# Patient Record
Sex: Male | Born: 1995 | Race: Black or African American | Hispanic: No | Marital: Single | Smoking: Former smoker
Health system: Southern US, Community
[De-identification: ages and names within clinical notes are randomized; demographics above are authoritative.]

## PROBLEM LIST (undated history)

## (undated) DIAGNOSIS — I10 Essential (primary) hypertension: Secondary | ICD-10-CM

---
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
## 2018-02-05 ENCOUNTER — Encounter (HOSPITAL_COMMUNITY): Payer: Self-pay | Admitting: Physical Therapy

## 2018-12-16 ENCOUNTER — Other Ambulatory Visit: Payer: Self-pay

## 2018-12-16 DIAGNOSIS — Z20822 Contact with and (suspected) exposure to covid-19: Secondary | ICD-10-CM

## 2018-12-17 LAB — NOVEL CORONAVIRUS, NAA: SARS-CoV-2, NAA: DETECTED — AB

## 2018-12-18 ENCOUNTER — Telehealth: Payer: Self-pay

## 2018-12-18 NOTE — Telephone Encounter (Signed)
Rec'd call from Abigail Butts, Therapist, sports at [facility]  Reported she called a patient to advise of his COVID positive test result.  Reported "the pt. was very irate."  Reported he was upset because he tested positive, and his girlfriend tested negative.  Per [facility] Nurse, the pt. Was threatening to sue.  Stated she attempted to give him recommendations for quarantining.  Stated he refused to take any precautions.  Reported she was sending the police to his home with Stay at Home Orders.  Stated she wanted [facility] to made aware of this situation.

## 2019-05-13 ENCOUNTER — Ambulatory Visit (INDEPENDENT_AMBULATORY_CARE_PROVIDER_SITE_OTHER): Payer: Self-pay | Admitting: Family Medicine

## 2019-05-13 ENCOUNTER — Other Ambulatory Visit: Payer: Self-pay

## 2019-05-13 DIAGNOSIS — J039 Acute tonsillitis, unspecified: Secondary | ICD-10-CM

## 2019-05-13 MED ORDER — MAGIC MOUTHWASH: ORAL | 0 refills | Status: DC

## 2019-05-13 MED ORDER — DOXYCYCLINE HYCLATE 100 MG PO TABS: ORAL_TABLET | ORAL | 0 refills | Status: DC

## 2019-05-13 NOTE — Progress Notes (Signed)
   Subjective:  Audio only  Patient ID: Troy Goodman, male    DOB: [birth year], 23 y.o.   MRN: [id number]  Cough This is a new problem. Episode onset: 2-3 weeks. The cough is productive of sputum (dried blood? looks burgudy in color). Associated symptoms comments: Headache, diarrhea, tonsil swollen, pt states he may have tonsil stones due to coughing up white chunks, neck pain. He has tried nothing for the symptoms.   Virtual Visit via Video Note  I connected with Javan Gonzaga on 05/13/19 at  9:30 AM EST by a video enabled telemedicine application and verified that I am speaking with the correct person using two identifiers.  Location: Patient: home Provider: office   I discussed the limitations of evaluation and management by telemedicine and the availability of in person appointments. The patient expressed understanding and agreed to proceed.  History of Present Illness:    Observations/Objective:   Assessment and Plan:   Follow Up Instructions:    I discussed the assessment and treatment plan with the patient. The patient was provided an opportunity to ask questions and all were answered. The patient agreed with the plan and demonstrated an understanding of the instructions.   The patient was advised to call back or seek an in-person evaluation if the symptoms worsen or if the condition fails to improve as anticipated.  I provided 17 minutes of non-face-to-face time during this encounter.   Vicente Males, LPN    Review of Systems  Respiratory: Positive for cough.        Objective:   Physical Exam   Virtual     Assessment & Plan:  Impression exudative tonsillitis with productive bronchitis.  Patient states he already had Covid several months ago.  No fever no cough no shortness of breath no one else sick at home.  Will cover with antibiotics and Dukes Magic mouthwash.  Symptom care discussed with

## 2019-07-01 ENCOUNTER — Other Ambulatory Visit: Payer: Self-pay

## 2019-07-01 ENCOUNTER — Encounter: Payer: Self-pay | Admitting: Family Medicine

## 2019-07-01 ENCOUNTER — Ambulatory Visit (INDEPENDENT_AMBULATORY_CARE_PROVIDER_SITE_OTHER): Payer: Self-pay | Admitting: Family Medicine

## 2019-07-01 DIAGNOSIS — K219 Gastro-esophageal reflux disease without esophagitis: Secondary | ICD-10-CM

## 2019-07-01 MED ORDER — PANTOPRAZOLE SODIUM 40 MG PO TBEC: 40.0000 mg | DELAYED_RELEASE_TABLET | Freq: Every day | ORAL | 3 refills | Status: DC

## 2019-07-01 NOTE — Progress Notes (Signed)
   Subjective:    Patient ID: Troy Goodman, male    DOB: 06-27-1995, 24 y.o.   MRN: [id number]  HPI  Patient calls to discuss reflux. Patient states he is having indigestion and burping more than normal.  This is Dr. Lorin Picket over the office is this a.m. fine thank sounds like treating you Patient relates a lot of intermittent gas belching burning in the throat at times feels like there is something there but food does not get stuck or have difficulty going down states his overall energy level doing good Virtual Visit via Video Note  I connected with Troy Goodman on 07/01/19 at  2:00 PM EST by a video enabled telemedicine application and verified that I am speaking with the correct person using two identifiers.  Location: Patient: home Provider: office   I discussed the limitations of evaluation and management by telemedicine and the availability of in person appointments. The patient expressed understanding and agreed to proceed.  History of Present Illness:    Observations/Objective:   Assessment and Plan:   Follow Up Instructions:    I discussed the assessment and treatment plan with the patient. The patient was provided an opportunity to ask questions and all were answered. The patient agreed with the plan and demonstrated an understanding of the instructions.   The patient was advised to call back or seek an in-person evaluation if the symptoms worsen or if the condition fails to improve as anticipated.  I provided 18 minutes of non-face-to-face time during this encounter.      Review of Systems  Constitutional: Negative for activity change, appetite change and fatigue.  HENT: Negative for congestion and rhinorrhea.   Respiratory: Negative for cough and shortness of breath.   Cardiovascular: Negative for chest pain and leg swelling.  Gastrointestinal: Negative for abdominal pain, nausea and vomiting.  Neurological: Negative for dizziness and headaches.    Psychiatric/Behavioral: Negative for agitation and behavioral problems.       Objective:   Physical Exam  Today's visit was via telephone Physical exam was not possible for this visit       Assessment & Plan:  GERD Recommend patient cut back on fast food try to minimize tomato based products pizza spaghetti caffeine's If not dramatically better within the next 2 weeks follow-up Pantoprazole daily.  Recommend wellness this spring or summer

## 2020-05-12 ENCOUNTER — Encounter: Payer: Self-pay | Admitting: Emergency Medicine

## 2020-05-12 ENCOUNTER — Ambulatory Visit
Admission: EM | Admit: 2020-05-12 | Discharge: 2020-05-12 | Disposition: A | Discharge status: Home / Self Care | Payer: Managed Care, Other (non HMO) | Attending: Family Medicine | Admitting: Family Medicine

## 2020-05-12 ENCOUNTER — Other Ambulatory Visit: Payer: Self-pay

## 2020-05-12 DIAGNOSIS — R6883 Chills (without fever): Secondary | ICD-10-CM

## 2020-05-12 DIAGNOSIS — R509 Fever, unspecified: Secondary | ICD-10-CM

## 2020-05-12 DIAGNOSIS — R197 Diarrhea, unspecified: Secondary | ICD-10-CM

## 2020-05-12 DIAGNOSIS — R42 Dizziness and giddiness: Secondary | ICD-10-CM | POA: Diagnosis not present

## 2020-05-12 DIAGNOSIS — R5383 Other fatigue: Secondary | ICD-10-CM | POA: Diagnosis not present

## 2020-05-12 DIAGNOSIS — R52 Pain, unspecified: Secondary | ICD-10-CM

## 2020-05-12 DIAGNOSIS — B349 Viral infection, unspecified: Secondary | ICD-10-CM

## 2020-05-12 MED ORDER — ACETAMINOPHEN 325 MG PO TABS
650.0000 mg | ORAL_TABLET | Freq: Once | ORAL | Status: AC
  Administered 2020-05-12: 650 mg via ORAL

## 2020-05-12 NOTE — ED Triage Notes (Addendum)
Dizziness, confusion, chills, body aches, diarrhea  that started x 2 days ago.  Pt's girlfriend is having same s/s. Pt had covid x 7 months ago

## 2020-05-12 NOTE — ED Provider Notes (Signed)
[facility]   [id number] 05/12/20 Arrival Time: 1036   CC: COVID symptoms  SUBJECTIVE: History from: patient.  Troy Goodman is a 24 y.o. male who presents with abrupt onset of fever, dizziness, confusion, chills, body aches, diarrhea for the last 2 days.  Reports that his girlfriend is sick and is having the same symptoms. Denies sick exposure to COVID, flu or strep. Denies recent travel. Has positive history of Covid x7 months ago. Has  completed Covid vaccines. Has not taken OTC medications for this. There are no aggravating or alleviating factors. Denies previous symptoms in the past. Denies fever, chills, fatigue, sinus pain, sore throat, SOB, wheezing, chest pain, nausea, changes in bowel or bladder habits.    ROS: As per HPI.  All other pertinent ROS negative.     History reviewed. No pertinent past medical history. History reviewed. No pertinent surgical history. No Known Allergies No current facility-administered medications on file prior to encounter.   Current Outpatient Medications on File Prior to Encounter  Medication Sig Dispense Refill  . pantoprazole (PROTONIX) 40 MG tablet Take 1 tablet (40 mg total) by mouth daily. 30 tablet 3   Social History   Socioeconomic History  . Marital status: Single    Spouse name: Not on file  . Number of children: Not on file  . Years of education: Not on file  . Highest education level: Not on file  Occupational History  . Not on file  Tobacco Use  . Smoking status: Never Smoker  . Smokeless tobacco: Never Used  Vaping Use  . Vaping Use: Every day  Substance and Sexual Activity  . Alcohol use: No  . Drug use: No  . Sexual activity: Not on file  Other Topics Concern  . Not on file  Social History Narrative  . Not on file   Social Determinants of Health   Financial Resource Strain: Not on file  Food Insecurity: Not on file  Transportation Needs: Not on file  Physical Activity: Not on file  Stress: Not  on file  Social Connections: Not on file  Intimate Partner Violence: Not on file   No family history on file.  OBJECTIVE:  Vitals:   05/12/20 1154 05/12/20 1156  BP:  128/83  Pulse:  92  Resp:  18  Temp:  (!) 100.5 F (38.1 C)  TempSrc:  Oral  SpO2:  96%  Weight: 237 lb (107.5 kg)   Height: 5\' 9"  (1.753 m)      General appearance: alert; appears fatigued, but nontoxic; speaking in full sentences and tolerating own secretions HEENT: NCAT; Ears: EACs clear, TMs pearly gray; Eyes: PERRL.  EOM grossly intact. Sinuses: nontender; Nose: nares patent without rhinorrhea, Throat: oropharynx erythematous, cobblestoning present, tonsils non erythematous or enlarged, uvula midline  Neck: supple with LAD Lungs: unlabored respirations, symmetrical air entry; cough: absent; no respiratory distress; CTAB Heart: regular rate and rhythm.  Radial pulses 2+ symmetrical bilaterally Skin: warm and dry Psychological: alert and cooperative; normal mood and affect  LABS:  No results found for this or any previous visit (from the past 24 hour(s)).   ASSESSMENT & PLAN:  1. Viral illness   2. Chills   3. Fever, unspecified fever cause   4. Other fatigue   5. Dizziness and giddiness   6. Body aches   7. Diarrhea, unspecified type     Meds ordered this encounter  Medications  . acetaminophen (TYLENOL) tablet 650 mg    Continue supportive care at  home COVID testing ordered.  It will take between 1-2 days for test results.  Someone will contact you regarding abnormal results.   Work note provided Patient should remain in quarantine until they have received Covid results.  If negative you may resume normal activities (go back to work/school) while practicing hand hygiene, social distance, and mask wearing.  If positive, patient should remain in quarantine for 10 days from symptom onset AND greater than 72 hours after symptoms resolution (absence of fever without the use of fever-reducing  medication and improvement in respiratory symptoms), whichever is longer Get plenty of rest and push fluids Use OTC zyrtec for nasal congestion, runny nose, and/or sore throat Use OTC flonase for nasal congestion and runny nose Use medications daily for symptom relief Use OTC medications like ibuprofen or tylenol as needed fever or pain Call or go to the ED if you have any new or worsening symptoms such as fever, worsening cough, shortness of breath, chest tightness, chest pain, turning blue, changes in mental status.  Reviewed expectations re: course of current medical issues. Questions answered. Outlined signs and symptoms indicating need for more acute intervention. Patient verbalized understanding. After Visit Summary given.         Moshe Cipro, NP 05/12/20 1214

## 2020-05-12 NOTE — Discharge Instructions (Addendum)
Increase fluids  Your COVID and Flu tests are pending.  You should self quarantine until the test results are back.    Take Tylenol or ibuprofen as needed for fever or discomfort.  Rest and keep yourself hydrated.    Follow-up with your primary care provider if your symptoms are not improving.

## 2020-05-13 LAB — COVID-19, FLU A+B NAA
Influenza A, NAA: NOT DETECTED
Influenza B, NAA: NOT DETECTED
SARS-CoV-2, NAA: DETECTED — AB

## 2020-10-27 ENCOUNTER — Telehealth: Payer: Self-pay

## 2020-10-27 DIAGNOSIS — Z0000 Encounter for general adult medical examination without abnormal findings: Secondary | ICD-10-CM

## 2020-10-27 NOTE — Telephone Encounter (Signed)
Lipid, CMP-wellness

## 2020-10-27 NOTE — Telephone Encounter (Signed)
Need blood work for phy in Aug   Pt call back [phone]

## 2020-10-27 NOTE — Telephone Encounter (Signed)
No recent labs completed. Please advise. Thank you 

## 2020-10-28 NOTE — Telephone Encounter (Signed)
Bw orders put in and tried to call. No answer. Vm not set up

## 2020-11-02 NOTE — Telephone Encounter (Signed)
Patient has been informed of his lab orders in the system to have drawn prior to appt.

## 2020-12-13 ENCOUNTER — Ambulatory Visit (INDEPENDENT_AMBULATORY_CARE_PROVIDER_SITE_OTHER): Payer: Managed Care, Other (non HMO) | Admitting: Family Medicine

## 2020-12-13 ENCOUNTER — Other Ambulatory Visit: Payer: Self-pay

## 2020-12-13 ENCOUNTER — Encounter: Payer: Self-pay | Admitting: Family Medicine

## 2020-12-13 VITALS — BP 124/78 | HR 72 | Temp 97.5°F | Ht 69.0 in | Wt 224.0 lb

## 2020-12-13 DIAGNOSIS — R140 Abdominal distension (gaseous): Secondary | ICD-10-CM | POA: Diagnosis not present

## 2020-12-13 DIAGNOSIS — Z0000 Encounter for general adult medical examination without abnormal findings: Secondary | ICD-10-CM

## 2020-12-13 DIAGNOSIS — Z0001 Encounter for general adult medical examination with abnormal findings: Secondary | ICD-10-CM

## 2020-12-13 DIAGNOSIS — R1030 Lower abdominal pain, unspecified: Secondary | ICD-10-CM

## 2020-12-13 NOTE — Progress Notes (Signed)
   Subjective:    Patient ID: Troy Goodman, male    DOB: 04-28-1996, 25 y.o.   MRN: [id number]  HPI  The patient comes in today for a wellness visit. Extremely nice patient Working hard on losing weight Staying active Does not smoke does not drink Does not abuse drugs Moods are good overall   A review of their health history was completed.  A review of medications was also completed.  Any needed refills; no  Eating habits: improved   Falls/  MVA accidents in past few months: no  Regular exercise: work related  Specialist pt sees on regular basis: no  Preventative health issues were discussed.   Additional concerns: indigestion- belching, gas and bloating   Review of Systems     Objective:   Physical Exam   General-in no acute distress Eyes-no discharge Lungs-respiratory rate normal, CTA CV-no murmurs,RRR Extremities skin warm dry no edema Neuro grossly normal Behavior normal, alert GU normal abdominal wall no hernia     Assessment & Plan:  1. Well adult exam Adult wellness-complete.wellness physical was conducted today. Importance of diet and exercise were discussed in detail.  In addition to this a discussion regarding safety was also covered. We also reviewed over immunizations and gave recommendations regarding current immunization needed for age.  In addition to this additional areas were also touched on including: Preventative health exams needed:  Colonoscopy not indicated  Patient was advised yearly wellness exam   2. Bloating Intermittent bloating patient was encouraged to try to minimize tomato based products, pizza, spaghetti, caffeine's, chocolates  3. Lower abdominal pain Has intermittent sharp pains in the lower abdomen on the right side no hernia was detected.  He notices this sometimes when he is coughing more than likely musculoskeletal Lab work ordered

## 2020-12-13 NOTE — Patient Instructions (Signed)

## 2021-01-27 ENCOUNTER — Other Ambulatory Visit: Payer: Self-pay

## 2021-01-27 ENCOUNTER — Ambulatory Visit
Admission: EM | Admit: 2021-01-27 | Discharge: 2021-01-27 | Disposition: A | Discharge status: Home / Self Care | Payer: Managed Care, Other (non HMO) | Attending: Emergency Medicine | Admitting: Emergency Medicine

## 2021-01-27 ENCOUNTER — Telehealth: Payer: Self-pay | Admitting: Family Medicine

## 2021-01-27 ENCOUNTER — Encounter: Payer: Self-pay | Admitting: *Deleted

## 2021-01-27 DIAGNOSIS — R030 Elevated blood-pressure reading, without diagnosis of hypertension: Secondary | ICD-10-CM | POA: Diagnosis not present

## 2021-01-27 MED ORDER — AMLODIPINE BESYLATE 5 MG PO TABS: 5.0000 mg | ORAL_TABLET | Freq: Every day | ORAL | 0 refills | Status: DC

## 2021-01-27 NOTE — Discharge Instructions (Signed)
Will start on amlodipine given high readings at home, and unable to get in with PCP.   Please continue to monitor blood pressure at home and keep a log Eat a well balanced diet of fruits, vegetables and lean meats.  Avoid foods high in fat and salt Drink water.  At least half your body weight in ounces Follow up with PCP ASAP for evaluation.  Elevated blood pressure should be managed by your PCP.  If you are unable to get in with PCP, try finding a new PCP that can see you  Go to the ED if you have any new or worsening symptoms such as vision changes, fatigue, dizziness, chest pain, shortness of breath, nausea, swelling in your hands or feet, urinary symptoms, etc..Marland Kitchen

## 2021-01-27 NOTE — Telephone Encounter (Signed)
Pt contacted office. Pt states that his blood pressure has been evaluated and feels like is heart rate irregular. Pt did have to leave work early. Yesterday he felt like their was an air bubble in his chest when stretching. Pt states that has subsided. Pt reports blood pressure of 158/106 today and 168/116 yesterday. Pt also reports headache.   Spoke with provider regarding patient; provider recommends Urgent Care due to being booked today.   Attempted to contact patient x2; pt did answer but was unable to hear me.  Contacted patient and pt verbalized understanding.

## 2021-01-27 NOTE — ED Triage Notes (Signed)
Pt reports elevated BP yesterday  168/116  and BP at home 158/108. Pt reports multiple family members with HI BP. Pt was sent by PCP today.

## 2021-01-27 NOTE — ED Provider Notes (Signed)
[facility]   [id number] 01/27/21 Arrival Time: 1033  CC: elevated blood pressure  SUBJECTIVE:  Troy Goodman is a 25 y.o. male who presents for blood pressure check.  144/84 in office.  States BP was 168/116 and 158/108 at home.  Denies hx of HTN.  Does have a PCP, but was advised to be evaluated here.  Reports HA.  Denies vision changes, dizziness, lightheadedness, chest pain, shortness of breath, numbness or tingling in extremities, abdominal pain, changes in bowel or bladder habits.     ROS: As per HPI.  All other pertinent ROS negative.     History reviewed. No pertinent past medical history. History reviewed. No pertinent surgical history. No Known Allergies No current facility-administered medications on file prior to encounter.   No current outpatient medications on file prior to encounter.   Social History   Socioeconomic History   Marital status: Single    Spouse name: Not on file   Number of children: Not on file   Years of education: Not on file   Highest education level: Not on file  Occupational History   Not on file  Tobacco Use   Smoking status: Never   Smokeless tobacco: Never  Vaping Use   Vaping Use: Every day  Substance and Sexual Activity   Alcohol use: No   Drug use: No   Sexual activity: Not on file  Other Topics Concern   Not on file  Social History Narrative   Not on file   Social Determinants of Health   Financial Resource Strain: Not on file  Food Insecurity: Not on file  Transportation Needs: Not on file  Physical Activity: Not on file  Stress: Not on file  Social Connections: Not on file  Intimate Partner Violence: Not on file   History reviewed. No pertinent family history.  OBJECTIVE:  Vitals:   01/27/21 1041  BP: (!) 144/84  Pulse: 83  Resp: 16  Temp: 98.4 F (36.9 C)  TempSrc: Oral  SpO2: 96%    General appearance: alert; no distress Eyes: PERRLA; EOMI HENT: normocephalic; atraumatic Neck: supple  with FROM Lungs: clear to auscultation bilaterally Heart: regular rate and rhythm.   Extremities: no edema; symmetrical with no gross deformities Skin: warm and dry Neurologic: CN 2-12 grossly intact; normal gait Psychological: alert and cooperative; normal mood and affect  ASSESSMENT & PLAN:  1. Elevated blood pressure reading     Meds ordered this encounter  Medications   amLODipine (NORVASC) 5 MG tablet    Sig: Take 1 tablet (5 mg total) by mouth daily.    Dispense:  20 tablet    Refill:  0    Order Specific Question:   Supervising Provider    Answer:   Eustace Moore [[id number]]   Will start on amlodipine given high readings at home, and unable to get in with PCP.   Please continue to monitor blood pressure at home and keep a log Eat a well balanced diet of fruits, vegetables and lean meats.  Avoid foods high in fat and salt Drink water.  At least half your body weight in ounces Follow up with PCP ASAP for evaluation.  Elevated blood pressure should be managed by your PCP.  If you are unable to get in with PCP, try finding a new PCP that can see you  Go to the ED if you have any new or worsening symptoms such as vision changes, fatigue, dizziness, chest pain, shortness of breath, nausea, swelling  in your hands or feet, urinary symptoms, etc...   Reviewed expectations re: course of current medical issues. Questions answered. Outlined signs and symptoms indicating need for more acute intervention. Patient verbalized understanding. After Visit Summary given.    Rennis Harding, PA-C 01/27/21 1101

## 2021-08-08 ENCOUNTER — Ambulatory Visit: Payer: Managed Care, Other (non HMO) | Admitting: Nurse Practitioner

## 2022-01-30 ENCOUNTER — Ambulatory Visit: Payer: Managed Care, Other (non HMO) | Admitting: Family Medicine

## 2022-01-30 VITALS — BP 128/88 | HR 83 | Temp 97.3°F | Ht 69.0 in | Wt 244.0 lb

## 2022-01-30 DIAGNOSIS — Z0000 Encounter for general adult medical examination without abnormal findings: Secondary | ICD-10-CM

## 2022-01-30 DIAGNOSIS — F339 Major depressive disorder, recurrent, unspecified: Secondary | ICD-10-CM
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
## 2022-01-31 LAB — CBC WITH DIFFERENTIAL/PLATELET
Basophils Absolute: 0.1 10*3/uL (ref 0.0–0.2)
Basos: 1 %
EOS (ABSOLUTE): 0.2 10*3/uL (ref 0.0–0.4)
Eos: 2 %
Hematocrit: 46.7 % (ref 37.5–51.0)
Hemoglobin: 14.6 g/dL (ref 13.0–17.7)
Immature Grans (Abs): 0 10*3/uL (ref 0.0–0.1)
Immature Granulocytes: 0 %
Lymphocytes Absolute: 2.4 10*3/uL (ref 0.7–3.1)
Lymphs: 33 %
MCH: 23.3 pg — ABNORMAL LOW (ref 26.6–33.0)
MCHC: 31.3 g/dL — ABNORMAL LOW (ref 31.5–35.7)
MCV: 75 fL — ABNORMAL LOW (ref 79–97)
Monocytes Absolute: 0.3 10*3/uL (ref 0.1–0.9)
Monocytes: 5 %
Neutrophils Absolute: 4.1 10*3/uL (ref 1.4–7.0)
Neutrophils: 59 %
Platelets: 292 10*3/uL (ref 150–450)
RBC: 6.26 x10E6/uL — ABNORMAL HIGH (ref 4.14–5.80)
RDW: 12.6 % (ref 11.6–15.4)
WBC: 7 10*3/uL (ref 3.4–10.8)

## 2022-01-31 LAB — LIPID PANEL
Chol/HDL Ratio: 3.7 ratio (ref 0.0–5.0)
Cholesterol, Total: 162 mg/dL (ref 100–199)
HDL: 44 mg/dL (ref >39)
LDL Chol Calc (NIH): 102 mg/dL — ABNORMAL HIGH (ref 0–99)
Triglycerides: 85 mg/dL (ref 0–149)
VLDL Cholesterol Cal: 16 mg/dL (ref 5–40)

## 2022-01-31 LAB — COMPREHENSIVE METABOLIC PANEL
ALT: 51 IU/L — ABNORMAL HIGH (ref 0–44)
AST: 31 IU/L (ref 0–40)
Albumin/Globulin Ratio: 1.8 (ref 1.2–2.2)
Albumin: 4.8 g/dL (ref 4.3–5.2)
Alkaline Phosphatase: 65 IU/L (ref 44–121)
BUN/Creatinine Ratio: 9 (ref 9–20)
BUN: 10 mg/dL (ref 6–20)
Bilirubin Total: 0.5 mg/dL (ref 0.0–1.2)
CO2: 22 mmol/L (ref 20–29)
Calcium: 9.7 mg/dL (ref 8.7–10.2)
Chloride: 102 mmol/L (ref 96–106)
Creatinine, Ser: 1.12 mg/dL (ref 0.76–1.27)
Globulin, Total: 2.6 g/dL (ref 1.5–4.5)
Glucose: 100 mg/dL — ABNORMAL HIGH (ref 70–99)
Potassium: 4.2 mmol/L (ref 3.5–5.2)
Sodium: 141 mmol/L (ref 134–144)
Total Protein: 7.4 g/dL (ref 6.0–8.5)
eGFR: 94 mL/min/{1.73_m2} (ref >59)

## 2022-01-31 LAB — TSH: TSH: 2.04 u[IU]/mL (ref 0.450–4.500)

## 2022-02-20 ENCOUNTER — Ambulatory Visit: Payer: Managed Care, Other (non HMO) | Admitting: Family Medicine

## 2022-03-22 ENCOUNTER — Ambulatory Visit
Admission: EM | Admit: 2022-03-22 | Discharge: 2022-03-22 | Disposition: A | Discharge status: Home / Self Care | Payer: Managed Care, Other (non HMO) | Attending: Family Medicine | Admitting: Family Medicine

## 2022-03-22 DIAGNOSIS — J029 Acute pharyngitis, unspecified: Secondary | ICD-10-CM | POA: Diagnosis present

## 2022-03-22 DIAGNOSIS — Z1152 Encounter for screening for COVID-19: Secondary | ICD-10-CM | POA: Diagnosis not present

## 2022-03-22 LAB — RESP PANEL BY RT-PCR (FLU A&B, COVID) ARPGX2
Influenza A by PCR: NEGATIVE
Influenza B by PCR: NEGATIVE
SARS Coronavirus 2 by RT PCR: NEGATIVE

## 2022-03-22 LAB — POCT RAPID STREP A (OFFICE): Rapid Strep A Screen: NEGATIVE

## 2022-03-22 NOTE — ED Triage Notes (Signed)
PT reports sore throat, coughing, scratchy throat and hard to swallow for about two days now. Gargled with warm water and alt, and drunk tea and halls which provided slight relief.

## 2022-03-22 NOTE — ED Provider Notes (Signed)
[facility]    CSN: [id number] Arrival date & time: 03/22/22  1047      History   Chief Complaint No chief complaint on file.   HPI Troy Goodman is a 26 y.o. male.   Presenting today with 2-day history of sore scratchy throat, cough, congestion.  Denies fever, chills, chest pain, shortness of breath, abdominal pain, nausea vomiting or diarrhea.  So far trying teas and cough drops with minimal relief of symptoms.  No known sick contacts recently.    History reviewed. No pertinent past medical history.  There are no problems to display for this patient.   History reviewed. No pertinent surgical history.     Home Medications    Prior to Admission medications   Medication Sig Start Date End Date Taking? Authorizing Provider  amLODipine (NORVASC) 5 MG tablet Take 1 tablet (5 mg total) by mouth daily. Patient not taking: Reported on 01/30/2022 01/27/21   Rennis Harding, PA-C    Family History History reviewed. No pertinent family history.  Social History Social History   Tobacco Use   Smoking status: Never   Smokeless tobacco: Never  Vaping Use   Vaping Use: Every day  Substance Use Topics   Alcohol use: No   Drug use: No     Allergies   Patient has no known allergies.   Review of Systems Review of Systems PER HPI  Physical Exam Triage Vital Signs ED Triage Vitals  Enc Vitals Group     BP 03/22/22 1107 124/79     Pulse Rate 03/22/22 1107 81     Resp 03/22/22 1107 20     Temp 03/22/22 1107 98.7 F (37.1 C)     Temp Source 03/22/22 1107 Oral     SpO2 03/22/22 1107 96 %     Weight --      Height --      Head Circumference --      Peak Flow --      Pain Score 03/22/22 1110 0     Pain Loc --      Pain Edu? --      Excl. in [place]? --    No data found.  Updated Vital Signs BP 124/79 (BP Location: Right Arm)   Pulse 81   Temp 98.7 F (37.1 C) (Oral)   Resp 20   SpO2 96%   Visual Acuity Right Eye Distance:   Left Eye  Distance:   Bilateral Distance:    Right Eye Near:   Left Eye Near:    Bilateral Near:     Physical Exam Vitals and nursing note reviewed.  Constitutional:      Appearance: He is well-developed.  HENT:     Head: Atraumatic.     Right Ear: External ear normal.     Left Ear: External ear normal.     Nose: Rhinorrhea present.     Mouth/Throat:     Mouth: Mucous membranes are moist.     Pharynx: Posterior oropharyngeal erythema present. No oropharyngeal exudate.  Eyes:     Conjunctiva/sclera: Conjunctivae normal.     Pupils: Pupils are equal, round, and reactive to light.  Cardiovascular:     Rate and Rhythm: Normal rate and regular rhythm.     Heart sounds: Normal heart sounds.  Pulmonary:     Effort: Pulmonary effort is normal. No respiratory distress.     Breath sounds: No wheezing or rales.  Musculoskeletal:        General:  Normal range of motion.     Cervical back: Normal range of motion and neck supple.  Lymphadenopathy:     Cervical: No cervical adenopathy.  Skin:    General: Skin is warm and dry.  Neurological:     Mental Status: He is alert and oriented to person, place, and time.  Psychiatric:        Behavior: Behavior normal.      UC Treatments / Results  Labs (all labs ordered are listed, but only abnormal results are displayed) Labs Reviewed  RESP PANEL BY RT-PCR (FLU A&B, COVID) ARPGX2  CULTURE, GROUP A STREP [facility])  POCT RAPID STREP A (OFFICE)    EKG   Radiology No results found.  Procedures Procedures (including critical care time)  Medications Ordered in UC Medications - No data to display  Initial Impression / Assessment and Plan / UC Course  I have reviewed the triage vital signs and the nursing notes.  Pertinent labs & imaging results that were available during my care of the patient were reviewed by me and considered in my medical decision making (see chart for details).     Signs and exam overall very reassured and suggestive of  a viral upper respiratory infection.  Rapid strep negative, throat culture and respiratory panel pending.  Treat with supportive over-the-counter medications and home care while awaiting results.  Work note given.  Return for worsening symptoms.  Final Clinical Impressions(s) / UC Diagnoses   Final diagnoses:  Acute pharyngitis, unspecified etiology   Discharge Instructions   None    ED Prescriptions   None    PDMP not reviewed this encounter.   Particia Nearing, [place] 03/22/22 1212

## 2022-03-24 LAB — CULTURE, GROUP A STREP (THRC)

## 2022-05-26 ENCOUNTER — Ambulatory Visit
Admission: EM | Admit: 2022-05-26 | Discharge: 2022-05-26 | Disposition: A | Discharge status: Home / Self Care | Payer: Managed Care, Other (non HMO) | Attending: Nurse Practitioner | Admitting: Nurse Practitioner

## 2022-05-26 ENCOUNTER — Encounter: Payer: Self-pay | Admitting: Emergency Medicine

## 2022-05-26 DIAGNOSIS — F329 Major depressive disorder, single episode, unspecified: Secondary | ICD-10-CM

## 2022-05-26 DIAGNOSIS — I10 Essential (primary) hypertension: Secondary | ICD-10-CM

## 2022-05-26 HISTORY — DX: Essential (primary) hypertension: I10

## 2022-05-26 MED ORDER — AMLODIPINE BESYLATE 5 MG PO TABS: 5.0000 mg | ORAL_TABLET | Freq: Every day | ORAL | 0 refills | Status: DC

## 2022-05-26 NOTE — Discharge Instructions (Addendum)
[facility] [place], [place] [address] Open 24 hours [phone]  I have refilled your Amlodipine 5 mg please follow up with your PCP
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
## 2022-06-13 ENCOUNTER — Encounter: Payer: Self-pay | Admitting: Family Medicine

## 2022-07-03 ENCOUNTER — Ambulatory Visit: Payer: Managed Care, Other (non HMO) | Admitting: Family Medicine

## 2022-07-03 VITALS — BP 120/84 | Wt 249.6 lb

## 2022-07-03 DIAGNOSIS — E669 Obesity, unspecified: Secondary | ICD-10-CM | POA: Diagnosis not present

## 2022-07-03 DIAGNOSIS — G4730 Sleep apnea, unspecified: Secondary | ICD-10-CM | POA: Diagnosis not present

## 2022-07-03 DIAGNOSIS — F3342 Major depressive disorder, recurrent, in full remission: Secondary | ICD-10-CM | POA: Diagnosis not present

## 2022-07-03 NOTE — Patient Instructions (Signed)
Correction-your wellness is due in September  Also we will have the sleep medicine doctor reach out to you to schedule an appointment  I will send information to you regarding healthy eating and weight management  [facility]Care-Dr. Nicki Reaper

## 2022-07-03 NOTE — Progress Notes (Signed)
   Subjective:    Patient ID: Troy Goodman, male    DOB: [birth year], 27 y.o.   MRN: [id number]  HPI Patient arrives today to discuss weight loss management. Patient states he is having issues with snoring.   Patient relates he is having sleep apnea symptoms snoring fatigue tiredness feeling rundown He also relates significant obesity he tends to eat quick foods out of the vending machine or fast foods or eats what ever is fixed at home tries to be somewhat careful with liquids [place] some walking but not a lot of exercise   Review of Systems     Objective:   Physical Exam General-in no acute distress Eyes-no discharge Lungs-respiratory rate normal, CTA CV-no murmurs,RRR Extremities skin warm dry no edema Neuro grossly normal Behavior normal, alert        Assessment & Plan:  Moderate obesity Healthy diet discussed Patient not depressed currently Patient states he takes blood pressure medicine intermittently when he feels stressed I told him that it is better to be on blood pressure medicine either every day or not on it at all currently not taking blood pressure medicine his blood pressure looks good so I do not feel he needs to take it currently We did discuss the importance of regular physical activity as well and trying to lose weight gradually over the course the next several months and a follow-up office visit by August for a wellness/September We will mail him some information regarding weight reduction  With his insurance he does not qualify for GLP-1's he is not diabetic

## 2022-08-21 ENCOUNTER — Institutional Professional Consult (permissible substitution): Payer: Managed Care, Other (non HMO) | Admitting: Neurology

## 2022-08-21 ENCOUNTER — Encounter: Payer: Self-pay | Admitting: Neurology

## 2022-09-14 ENCOUNTER — Emergency Department (HOSPITAL_COMMUNITY): Payer: Managed Care, Other (non HMO)

## 2022-09-14 ENCOUNTER — Other Ambulatory Visit: Payer: Self-pay

## 2022-09-14 ENCOUNTER — Emergency Department (HOSPITAL_COMMUNITY)
Admission: EM | Admit: 2022-09-14 | Discharge: 2022-09-14 | Disposition: A | Discharge status: Home / Self Care | Payer: Managed Care, Other (non HMO) | Attending: Emergency Medicine | Admitting: Emergency Medicine

## 2022-09-14 ENCOUNTER — Encounter (HOSPITAL_COMMUNITY): Payer: Self-pay | Admitting: Emergency Medicine

## 2022-09-14 DIAGNOSIS — R079 Chest pain, unspecified: Secondary | ICD-10-CM | POA: Insufficient documentation

## 2022-09-14 NOTE — ED Provider Notes (Signed)
Troy Goodman EMERGENCY DEPARTMENT AT [facility] Provider Note   CSN: [id number] Arrival date & time: 09/14/22  4098     History Chief Complaint  Patient presents with   Chest Pain    Troy Goodman is a 27 y.o. male.  Was hitting his vape pen strongly when he had a 5 second episode of pain behind his right scapula. No sob. No syncope. Pain has been gone since then. No previous episodes.    Chest Pain      Home Medications Prior to Admission medications   Medication Sig Start Date End Date Taking? Authorizing Provider  amLODipine (NORVASC) 5 MG tablet Take 1 tablet (5 mg total) by mouth daily. Patient taking differently: Take 5 mg by mouth daily. As needed 01/27/21   Wurst, [place], PA-C  amLODipine (NORVASC) 5 MG tablet Take 1 tablet (5 mg total) by mouth daily. 05/26/22 06/25/22  Barbette Merino, NP      Allergies    Patient has no known allergies.    Review of Systems   Review of Systems  Cardiovascular:  Positive for chest pain.    Physical Exam Updated Vital Signs BP (!) 133/92   Pulse 76   Temp 97.7 F (36.5 C) (Oral)   Resp 15   Ht 5\' 9"  (1.753 m)   Wt 111.1 kg   SpO2 96%   BMI 36.18 kg/m  Physical Exam Vitals and nursing note reviewed.  Constitutional:      Appearance: He is well-developed.  HENT:     Head: Normocephalic and atraumatic.  Cardiovascular:     Rate and Rhythm: Normal rate.  Pulmonary:     Effort: Pulmonary effort is normal. No respiratory distress.  Abdominal:     General: There is no distension.  Musculoskeletal:        General: Normal range of motion.     Cervical back: Normal range of motion.  Neurological:     Mental Status: He is alert.     ED Results / Procedures / Treatments   Labs (all labs ordered are listed, but only abnormal results are displayed) Labs Reviewed - No data to display  EKG None  Radiology DG Chest 2 View  Result Date: 09/14/2022 CLINICAL DATA:  27 year old male with history of chest  pain. Evaluate for potential pneumothorax. EXAM: CHEST - 2 VIEW COMPARISON:  Chest x-ray 11/20/2014. FINDINGS: Lung volumes are normal. No consolidative airspace disease. No pleural effusions. No pneumothorax. No pulmonary nodule or mass noted. Pulmonary vasculature and the cardiomediastinal silhouette are within normal limits. IMPRESSION: No radiographic evidence of acute cardiopulmonary disease. Electronically Signed   By: Trudie Reed M.D.   On: 09/14/2022 05:10    Procedures Procedures    Medications Ordered in ED Medications - No data to display  ED Course/ Medical Decision Making/ A&P                             Medical Decision Making Amount and/or Complexity of Data Reviewed Radiology: ordered. ECG/medicine tests: ordered.   Initial concern for possible pneumothorax so cxr done. Viewed and interpreted by myself without evidence of PTX, PNA or other obvious cause. ECG reassuring, doubt ACS. No RF's or symptoms of PE. Stable for discharge.   Final Clinical Impression(s) / ED Diagnoses Final diagnoses:  Nonspecific chest pain    Rx / DC Orders ED Discharge Orders     None  Marily Memos, MD 09/14/22 [phone]

## 2022-09-14 NOTE — ED Triage Notes (Signed)
Pt c/o chest pain that started after he woke up this morning and hit his vape. Pt also c/o a popping feeling in his upper back.

## 2022-09-15 ENCOUNTER — Telehealth: Payer: Self-pay

## 2022-09-15 NOTE — Transitions of Care (Post Inpatient/ED Visit) (Signed)
   09/15/2022  Name: Troy Goodman MRN: [id number] DOB: 11-18-1995  Today's TOC FU Call Status: Today's TOC FU Call Status:: Unsuccessul Call (1st Attempt) Unsuccessful Call (1st Attempt) Date: 09/15/22  Attempted to reach the patient regarding the most recent Inpatient/ED visit.  Follow Up Plan: Additional outreach attempts will be made to reach the patient to complete the Transitions of Care (Post Inpatient/ED visit) call.   Signature Karena Addison, LPN [facility] Nurse Health Advisor Direct Dial [phone]

## 2022-09-21 NOTE — Transitions of Care (Post Inpatient/ED Visit) (Signed)
   09/21/2022  Name: Troy Goodman MRN: [id number] DOB: [birth year]  Today's TOC FU Call Status: Today's TOC FU Call Status:: Unsuccessful Call (2nd Attempt) Unsuccessful Call (1st Attempt) Date: 09/15/22 Unsuccessful Call (2nd Attempt) Date: 09/21/22  Attempted to reach the patient regarding the most recent Inpatient/ED visit.  Follow Up Plan: Additional outreach attempts will be made to reach the patient to complete the Transitions of Care (Post Inpatient/ED visit) call.   Signature Karena Addison, LPN [facility] Nurse Health Advisor Direct Dial [phone]

## 2022-09-26 NOTE — Transitions of Care (Post Inpatient/ED Visit) (Signed)
   09/26/2022  Name: Troy Goodman MRN: [id number] DOB: 04-May-1996  Today's TOC FU Call Status: Today's TOC FU Call Status:: Unsuccessful Call (3rd Attempt) Unsuccessful Call (1st Attempt) Date: 09/15/22 Unsuccessful Call (2nd Attempt) Date: 09/21/22 Unsuccessful Call (3rd Attempt) Date: 09/26/22  Attempted to reach the patient regarding the most recent Inpatient/ED visit.  Follow Up Plan: No further outreach attempts will be made at this time. We have been unable to contact the patient.  Signature Karena Addison, LPN [facility] Nurse Health Advisor Direct Dial [phone]

## 2022-11-06 ENCOUNTER — Ambulatory Visit: Payer: Managed Care, Other (non HMO) | Admitting: Neurology

## 2022-11-06 ENCOUNTER — Encounter: Payer: Self-pay | Admitting: Neurology

## 2022-11-06 VITALS — BP 125/69 | HR 72 | Ht 70.0 in | Wt 235.0 lb

## 2022-11-06 DIAGNOSIS — R0683 Snoring: Secondary | ICD-10-CM

## 2022-11-06 DIAGNOSIS — Z9189 Other specified personal risk factors, not elsewhere classified: Secondary | ICD-10-CM

## 2022-11-06 DIAGNOSIS — R0689 Other abnormalities of breathing: Secondary | ICD-10-CM | POA: Diagnosis not present

## 2022-11-06 DIAGNOSIS — R0681 Apnea, not elsewhere classified: Secondary | ICD-10-CM

## 2022-11-06 DIAGNOSIS — G4719 Other hypersomnia: Secondary | ICD-10-CM | POA: Diagnosis not present

## 2022-11-06 DIAGNOSIS — E669 Obesity, unspecified: Secondary | ICD-10-CM

## 2022-11-06 NOTE — Patient Instructions (Signed)

## 2022-11-06 NOTE — Progress Notes (Signed)
Subjective:    Patient ID: Troy Goodman is a 27 y.o. male.  HPI    Huston Foley, MD, PhD [facility] [address] [address] [place], [place] [address]  Dear Dr. Gerda Diss,  I saw your patient, Troy Goodman, upon your kind request in my sleep clinic today for initial consultation of his sleep disorder, in particular, concern for underlying obstructive sleep apnea.  The patient is unaccompanied today. He missed an appointment on 08/21/22.  As you know, Troy Goodman is a 27 year old male with an underlying medical history of hypertension, obesity, and history of depression, who reports snoring and excessive daytime somnolence, as well as witnessed apneas (per GF).  He has woken up at times with a sense of gasping for air, took him some moments to get his breathing under control again.  He denies recurrent nocturnal or morning headaches or nocturia.  He is not aware of any family history of sleep apnea.  He does not always wake up fully rested.  His Epworth sleepiness score is 13 out of 24, fatigue severity score is 26 out of 63.  I reviewed your office note from 07/03/2022. He is working on weight loss and has lost about 10 lb in the past couple of weeks.  He works as a Museum/gallery exhibitions officer, he goes to bed between 8:30 PM and 9 PM and rise time is around 2 AM as he has to be at work at 3.  He does not drink daily caffeine, occasional energy drink.  He does not drink alcohol regularly, very occasionally, he quit smoking cigarettes about 4 years ago but does vape nicotine daily.  He has a TV in his bedroom and it tends to stay on at night.  He has no pets in the household, currently lives with his older sister.  He endorses some staying asleep currently, as he broke up with his girlfriend recently.   His Past Medical History Is Significant For: Past Medical History:  Diagnosis Date   Hypertension     His Past Surgical History Is Significant For: History reviewed. No  pertinent surgical history.  His Family History Is Significant For: Family History  Problem Relation Age of Onset   Snoring Father    Sleep apnea Neg Hx     His Social History Is Significant For: Social History   Socioeconomic History   Marital status: Single    Spouse name: Not on file   Number of children: Not on file   Years of education: Not on file   Highest education level: Not on file  Occupational History   Not on file  Tobacco Use   Smoking status: Former    Types: Cigarettes   Smokeless tobacco: Never   Tobacco comments:    Pt vapes daily   Vaping Use   Vaping Use: Every day   Substances: Nicotine  Substance and Sexual Activity   Alcohol use: Yes    Comment: states drink twice a week   Drug use: Yes    Frequency: 3.0 times per week    Types: Marijuana   Sexual activity: Not on file  Other Topics Concern   Not on file  Social History Narrative   Not on file   Social Determinants of Health   Financial Resource Strain: Not on file  Food Insecurity: Not on file  Transportation Needs: Not on file  Physical Activity: Not on file  Stress: Not on file  Social Connections: Not on file  His Allergies Are:  No Known Allergies:   His Current Medications Are:  Outpatient Encounter Medications as of 11/06/2022  Medication Sig   amLODipine (NORVASC) 5 MG tablet Take 1 tablet (5 mg total) by mouth daily. (Patient taking differently: Take 5 mg by mouth daily. As needed)   ASHWAGANDHA PO Take by mouth.   Moringa Oleifera (MORINGA PO) Take by mouth.   UNABLE TO FIND Med Name: Burdock root   amLODipine (NORVASC) 5 MG tablet Take 1 tablet (5 mg total) by mouth daily.   No facility-administered encounter medications on file as of 11/06/2022.  :   Review of Systems:  Out of a complete 14 point review of systems, all are reviewed and negative with the exception of these symptoms as listed below:  Review of Systems  Neurological:        Pt here for sleep  consult  Pt snores.hypertension,few headaches,fatigue  Pt denies sleep study,CPAP machine    ESS:13 FSS:26          Objective:  Neurological Exam  Physical Exam Physical Examination:   Vitals:   11/06/22 1338  BP: 125/69  Pulse: 72    General Examination: The patient is a very pleasant 27 y.o. male in no acute distress. He appears well-developed and well-nourished and well groomed.   HEENT: Normocephalic, atraumatic, pupils are equal, round and reactive to light, extraocular tracking is good without limitation to gaze excursion or nystagmus noted.  Corrective eyeglasses in place.  Hearing is grossly intact. Face is symmetric with normal facial animation. Speech is clear with no dysarthria noted. There is no hypophonia. There is no lip, neck/head, jaw or voice tremor. Neck is supple with full range of passive and active motion. There are no carotid bruits on auscultation. Oropharynx exam reveals: No significant mouth dryness, good dental hygiene, moderate airway crowding secondary to thicker soft palate, wider tongue base, wider uvula, Mallampati is class IV and tonsils and tip of uvula only visualized with the help of tongue blade.  Phonation alone does not help visualize his airway.  Neck circumference 17-1/8 inches, minimal overbite noted.  Tongue protrudes centrally and palate elevates symmetrically.   Chest: Clear to auscultation without wheezing, rhonchi or crackles noted.  Heart: S1+S2+0, regular and normal without murmurs, rubs or gallops noted.   Abdomen: Soft, non-tender and non-distended.  Extremities: There is no pitting edema in the distal lower extremities bilaterally.   Skin: Warm and dry without trophic changes noted.   Musculoskeletal: exam reveals no obvious joint deformities.   Neurologically:  Mental status: The patient is awake, alert and oriented in all 4 spheres. His immediate and remote memory, attention, language skills and fund of knowledge are  appropriate. There is no evidence of aphasia, agnosia, apraxia or anomia. Speech is clear with normal prosody and enunciation. Thought process is linear. Mood is normal and affect is normal.  Cranial nerves II - XII are as described above under HEENT exam.  Motor exam: Normal bulk, strength and tone is noted. There is no obvious action or resting tremor.  Fine motor skills and coordination: grossly intact.  Cerebellar testing: No dysmetria or intention tremor. There is no truncal or gait ataxia.  Sensory exam: intact to light touch in the upper and lower extremities.  Gait, station and balance: He stands easily. No veering to one side is noted. No leaning to one side is noted. Posture is age-appropriate and stance is narrow based. Gait shows normal stride length and normal pace. No  problems turning are noted.   Assessment and Plan:  In summary, Ponciano Shealy is a very pleasant 27 y.o.-year old male with an underlying medical history of hypertension, obesity, and history of depression, whose history and physical exam are concerning for sleep disordered breathing, particularly unspecified sleep apnea, with the main differential diagnoses of obstructive sleep apnea (OSA). While a laboratory attended sleep study is typically considered "gold standard" for evaluation of sleep disordered breathing, we mutually agreed to proceed with a home sleep test at this time specially given his sleep schedule.   I had a long chat with the patient about my findings and the diagnosis of sleep apnea, particularly OSA, its prognosis and treatment options. We talked about medical/conservative treatments, surgical interventions and non-pharmacological approaches for symptom control. I explained, in particular, the risks and ramifications of untreated moderate to severe OSA, especially with respect to developing cardiovascular disease down the road, including congestive heart failure (CHF), difficult to treat hypertension,  cardiac arrhythmias (particularly A-fib), neurovascular complications including TIA, stroke and dementia. Even type 2 diabetes has, in part, been linked to untreated OSA. Symptoms of untreated OSA may include (but may not be limited to) daytime sleepiness, nocturia (i.e. frequent nighttime urination), memory problems, mood irritability and suboptimally controlled or worsening mood disorder such as depression and/or anxiety, lack of energy, lack of motivation, physical discomfort, as well as recurrent headaches, especially morning or nocturnal headaches. We talked about the importance of maintaining a healthy lifestyle and striving for healthy weight.  The importance of complete nicotine cessation was also addressed.  In addition, we talked about the importance of striving for and maintaining good sleep hygiene, especially making enough time for sleep, he is advised to try to strive for 7 to 8 hours of sleep on any given night.  By the way, on the weekends he may go to bed around 11 PM and sleep till 8 or 9 AM. I recommended a sleep study at this time. I outlined the differences between a laboratory attended sleep study which is considered more comprehensive and accurate over the option of a home sleep test (HST); the latter may lead to underestimation of sleep disordered breathing in some instances and does not help with diagnosing upper airway resistance syndrome and is not accurate enough to diagnose primary central sleep apnea typically. I outlined possible surgical and non-surgical treatment options of OSA, including the use of a positive airway pressure (PAP) device (i.e. CPAP, AutoPAP/APAP or BiPAP in certain circumstances), a custom-made dental device (aka oral appliance, which would require a referral to a specialist dentist or orthodontist typically, and is generally speaking not considered for patients with full dentures or edentulous state), upper airway surgical options, such as traditional UPPP (which  is not considered a first-line treatment) or the Inspire device (hypoglossal nerve stimulator, which would involve a referral for consultation with an ENT surgeon, after careful selection, following inclusion criteria - also not first-line treatment). I explained the PAP treatment option to the patient in detail, as this is generally considered first-line treatment.  The patient indicated that he would be willing to try PAP therapy, if the need arises. I explained the importance of being compliant with PAP treatment, not only for insurance purposes but primarily to improve patient's symptoms symptoms, and for the patient's long term health benefit, including to reduce His cardiovascular risks longer-term.    We will pick up our discussion about the next steps and treatment options after testing.  We will keep him  posted as to the test results by phone call and/or MyChart messaging where possible.  We will plan to follow-up in sleep clinic accordingly as well.  I answered all his questions today and the patient was in agreement.   I encouraged him to call with any interim questions, concerns, problems or updates or email [place] through MyChart.  Generally speaking, sleep test authorizations may take up to 2 weeks, sometimes less, sometimes longer, the patient is encouraged to get in touch with [place] if they do not hear back from the sleep lab staff directly within the next 2 weeks.  Thank you very much for allowing me to participate in the care of this nice patient. If I can be of any further assistance to you please do not hesitate to call me at [phone].  Sincerely,   Huston Foley, MD, PhD

## 2023-03-29 ENCOUNTER — Ambulatory Visit (INDEPENDENT_AMBULATORY_CARE_PROVIDER_SITE_OTHER): Payer: Self-pay | Admitting: Family Medicine

## 2023-03-29 VITALS — BP 132/89 | HR 85 | Temp 97.6°F | Ht 70.0 in | Wt 220.6 lb

## 2023-03-29 DIAGNOSIS — R81 Glycosuria: Secondary | ICD-10-CM | POA: Insufficient documentation

## 2023-03-29 NOTE — Progress Notes (Signed)
Subjective:  Patient ID: Troy Goodman, male    DOB: 07-13-1995  Age: 27 y.o. MRN: [id number]  CC: Glucosuria   HPI:  27 year old male presents for evaluation the above.  Patient states that he had urine study done regarding a job and he was told that there was glucose in his urine.  The company has advised that he see a physician and get this worked up.  Otherwise, this puts restrictions on his job tasks.  I am not sure exactly why this would cause issues with his employer.  Nonetheless, he is here for work up regarding glucosuria.  He reports recent weight loss but states that it is intentional.  He has no other symptoms or concerns at this time.  Patient Active Problem List   Diagnosis Date Noted   Glucosuria 03/29/2023   Recurrent major depressive disorder, in full remission (HCC) 07/03/2022    Social Hx   Social History   Socioeconomic History   Marital status: Single    Spouse name: Not on file   Number of children: Not on file   Years of education: Not on file   Highest education level: Not on file  Occupational History   Not on file  Tobacco Use   Smoking status: Former    Types: Cigarettes   Smokeless tobacco: Never   Tobacco comments:    Pt vapes daily   Vaping Use   Vaping status: Every Day   Substances: Nicotine  Substance and Sexual Activity   Alcohol use: Yes    Comment: states drink twice a week   Drug use: Yes    Frequency: 3.0 times per week    Types: Marijuana   Sexual activity: Not on file  Other Topics Concern   Not on file  Social History Narrative   Not on file   Social Determinants of Health   Financial Resource Strain: Not on file  Food Insecurity: Not on file  Transportation Needs: Not on file  Physical Activity: Not on file  Stress: Not on file (03/26/2023)  Social Connections: Not on file    Review of Systems Per HPI  Objective:  BP 132/89   Pulse 85   Temp 97.6 F (36.4 C)   Ht 5\' 10"  (1.778 m)   Wt 220 lb 9.6 oz  (100.1 kg)   SpO2 98%   BMI 31.65 kg/m      03/29/2023    1:59 PM 11/06/2022    1:38 PM 09/14/2022    5:30 AM  BP/Weight  Systolic BP 132 125 129  Diastolic BP 89 69 87  Wt. (Lbs) 220.6 235   BMI 31.65 kg/m2 33.72 kg/m2     Physical Exam Constitutional:      General: He is not in acute distress.    Appearance: Normal appearance.  HENT:     Head: Normocephalic and atraumatic.  Cardiovascular:     Rate and Rhythm: Normal rate and regular rhythm.  Pulmonary:     Effort: Pulmonary effort is normal.     Breath sounds: Normal breath sounds.  Neurological:     Mental Status: He is alert.     Lab Results  Component Value Date   WBC 7.0 01/30/2022   HGB 14.6 01/30/2022   HCT 46.7 01/30/2022   PLT 292 01/30/2022   GLUCOSE 100 (H) 01/30/2022   CHOL 162 01/30/2022   TRIG 85 01/30/2022   HDL 44 01/30/2022   LDLCALC 102 (H) 01/30/2022   ALT 51 (H) 01/30/2022  AST 31 01/30/2022   NA 141 01/30/2022   K 4.2 01/30/2022   CL 102 01/30/2022   CREATININE 1.12 01/30/2022   BUN 10 01/30/2022   CO2 22 01/30/2022   TSH 2.040 01/30/2022     Assessment & Plan:   Problem List Items Addressed This Visit       Other   Glucosuria - Primary    Unsure if this finding is clinically significant.  I have no record of the result.  A1c for further evaluation.      Relevant Orders   Hemoglobin A1c    Everlene Other DO [facility]

## 2023-03-29 NOTE — Assessment & Plan Note (Signed)
Unsure if this finding is clinically significant.  I have no record of the result.  A1c for further evaluation.

## 2023-03-29 NOTE — Patient Instructions (Signed)
Lab today.   Result will be back tomorrow.  Take care  Dr. Adriana Simas

## 2023-03-30 ENCOUNTER — Telehealth: Payer: Self-pay

## 2023-03-30 LAB — HEMOGLOBIN A1C
Est. average glucose Bld gHb Est-mCnc: 100 mg/dL
Hgb A1c MFr Bld: 5.1 % (ref 4.8–5.6)

## 2023-03-30 NOTE — Telephone Encounter (Signed)
Copied from CRM [id number]. Topic: General - Other >> Mar 30, 2023  8:35 AM Georgeanna Harrison H wrote: Reason for CRM: PT would like for recent lab results to be faxed over to [place] [facility] [place].

## 2023-04-01 NOTE — Telephone Encounter (Signed)
Please assist patient.

## 2023-09-06 ENCOUNTER — Encounter (HOSPITAL_COMMUNITY): Payer: Self-pay

## 2023-09-06 ENCOUNTER — Ambulatory Visit (HOSPITAL_COMMUNITY)
Admission: EM | Admit: 2023-09-06 | Discharge: 2023-09-06 | Disposition: A | Discharge status: Home / Self Care | Attending: Family Medicine | Admitting: Family Medicine

## 2023-09-06 ENCOUNTER — Ambulatory Visit (INDEPENDENT_AMBULATORY_CARE_PROVIDER_SITE_OTHER)

## 2023-09-06 DIAGNOSIS — S93401A Sprain of unspecified ligament of right ankle, initial encounter: Secondary | ICD-10-CM

## 2023-09-06 NOTE — Discharge Instructions (Signed)
 You were seen today for ankle/foot pain.  I do not see anything abnormal on the xray today.  However, if the radiologist reads this differently we will notify you.  I have given you an ace wrap today for comfort.  Please keep it elevated, and use motrin  for pain/swelling.  I recommend ice as well.  Please return or follow up with your primary care provider if not improving.

## 2023-09-06 NOTE — ED Provider Notes (Signed)
 [facility]    CSN: [id number] Arrival date & time: 09/06/23  4098      History   Chief Complaint Chief Complaint  Patient presents with   Ankle Pain    HPI Troy Goodman is a 28 y.o. male.    Ankle Pain  Patient is here for right ankle/foot pain.  Yesterday he was running with his dog and stepped in a hole.  Felt and heard a crack.  He did have immediate pain.  He has been using heat/ice.  The pain woke up at 2am this morning.   He went to work, and has to walk a long way to clock it, and started having more pain.        Past Medical History:  Diagnosis Date   Hypertension     Patient Active Problem List   Diagnosis Date Noted   Glucosuria 03/29/2023   Recurrent major depressive disorder, in full remission (HCC) 07/03/2022    History reviewed. No pertinent surgical history.     Home Medications    Prior to Admission medications   Medication Sig Start Date End Date Taking? Authorizing Provider  ASHWAGANDHA PO Take by mouth.    [provider]  Moringa Oleifera (MORINGA PO) Take by mouth.    [provider]  UNABLE TO FIND Med Name: Burdock root    [provider]    Family History Family History  Problem Relation Age of Onset   Snoring Father    Sleep apnea Neg Hx     Social History Social History   Tobacco Use   Smoking status: Former    Types: Cigarettes   Smokeless tobacco: Never   Tobacco comments:    Pt vapes daily   Vaping Use   Vaping status: Every Day   Substances: Nicotine  Substance Use Topics   Alcohol use: Yes    Comment: states drink twice a week   Drug use: Yes    Frequency: 3.0 times per week    Types: Marijuana     Allergies   Patient has no known allergies.   Review of Systems Review of Systems  Constitutional: Negative.   HENT: Negative.    Respiratory: Negative.    Cardiovascular: Negative.   Gastrointestinal: Negative.   Musculoskeletal:  Positive for  arthralgias.     Physical Exam Triage Vital Signs ED Triage Vitals  Encounter Vitals Group     BP 09/06/23 0921 (!) 135/96     Systolic BP Percentile --      Diastolic BP Percentile --      Pulse Rate 09/06/23 0921 77     Resp 09/06/23 0921 16     Temp 09/06/23 0921 98.4 F (36.9 C)     Temp Source 09/06/23 0921 Oral     SpO2 09/06/23 0921 97 %     Weight --      Height --      Head Circumference --      Peak Flow --      Pain Score 09/06/23 0920 8     Pain Loc --      Pain Education --      Exclude from Growth Chart --    No data found.  Updated Vital Signs BP (!) 135/96 (BP Location: Left Arm)   Pulse 77   Temp 98.4 F (36.9 C) (Oral)   Resp 16   SpO2 97%   Visual Acuity Right Eye Distance:   Left Eye Distance:  Bilateral Distance:    Right Eye Near:   Left Eye Near:    Bilateral Near:     Physical Exam Constitutional:      Appearance: Normal appearance. He is normal weight.  Musculoskeletal:     Comments: Slight swelling to the proximal top of the foot;  He has TTP  posterior to the lateral malleolus;  TTP across the top of the proximal foot, just distal to the ankle;  decreased rom overall  Neurological:     General: No focal deficit present.     Mental Status: He is alert.  Psychiatric:        Mood and Affect: Mood normal.      UC Treatments / Results  Labs (all labs ordered are listed, but only abnormal results are displayed) Labs Reviewed - No data to display  EKG   Radiology No results found.  Procedures Procedures (including critical care time)  Medications Ordered in UC Medications - No data to display  Initial Impression / Assessment and Plan / UC Course  I have reviewed the triage vital signs and the nursing notes.  Pertinent labs & imaging results that were available during my care of the patient were reviewed by me and considered in my medical decision making (see chart for details).   Final Clinical Impressions(s) / UC  Diagnoses   Final diagnoses:  Sprain of right ankle, unspecified ligament, initial encounter     Discharge Instructions      You were seen today for ankle/foot pain.  I do not see anything abnormal on the xray today.  However, if the radiologist reads this differently we will notify you.  I have given you an ace wrap today for comfort.  Please keep it elevated, and use motrin  for pain/swelling.  I recommend ice as well.  Please return or follow up with your primary care provider if not improving.     ED Prescriptions   None    PDMP not reviewed this encounter.   Lesle Ras, MD 09/06/23 [phone]

## 2023-09-06 NOTE — ED Triage Notes (Signed)
 Patient here today with c/o right ankle pain due to right foot going into a hole in the ground while running around in the backyard with his dog yesterday. Patient states that he felt a crack. Patient has been taking Ibuprofen  with some relief.

## 2023-09-10 ENCOUNTER — Other Ambulatory Visit: Payer: Self-pay

## 2023-09-10 ENCOUNTER — Ambulatory Visit (HOSPITAL_COMMUNITY): Admission: EM | Admit: 2023-09-10 | Discharge: 2023-09-10 | Disposition: A | Discharge status: Home / Self Care

## 2023-09-10 ENCOUNTER — Encounter (HOSPITAL_COMMUNITY): Payer: Self-pay | Admitting: *Deleted

## 2023-09-10 DIAGNOSIS — S93401D Sprain of unspecified ligament of right ankle, subsequent encounter: Secondary | ICD-10-CM | POA: Diagnosis not present

## 2023-09-10 NOTE — ED Triage Notes (Signed)
 PT reports he was seen on Thursday for RT ankle. Pt needs extended work note because he feels his ankle is only 75% improved.

## 2023-09-10 NOTE — ED Triage Notes (Signed)
 PT DOB and full name confirmed before triage.

## 2023-09-10 NOTE — ED Provider Notes (Signed)
 [facility]    CSN: [id number] Arrival date & time: 09/10/23  1420      History   Chief Complaint Chief Complaint  Patient presents with   Ankle Pain    HPI Troy Goodman is a 28 y.o. male.  Presents for extension of work note.  Seen 4 days ago for right ankle injury.  X-ray was negative.  Advised supportive care for likely sprain.  Unfortunately patient does have to walk a lot at work which causes further ankle discomfort.  He does feel he is improving, just requesting one more day from work  Past Medical History:  Diagnosis Date   Hypertension     Patient Active Problem List   Diagnosis Date Noted   Glucosuria 03/29/2023   Recurrent major depressive disorder, in full remission (HCC) 07/03/2022    History reviewed. No pertinent surgical history.     Home Medications    Prior to Admission medications   Medication Sig Start Date End Date Taking? Authorizing Provider  ASHWAGANDHA PO Take by mouth.    [provider]  Moringa Oleifera (MORINGA PO) Take by mouth.    [provider]  UNABLE TO FIND Med Name: Burdock root    [provider]    Family History Family History  Problem Relation Age of Onset   Snoring Father    Sleep apnea Neg Hx     Social History Social History   Tobacco Use   Smoking status: Former    Types: Cigarettes   Smokeless tobacco: Never   Tobacco comments:    Pt vapes daily   Vaping Use   Vaping status: Every Day   Substances: Nicotine  Substance Use Topics   Alcohol use: Yes    Comment: states drink twice a week   Drug use: Yes    Frequency: 3.0 times per week    Types: Marijuana     Allergies   Patient has no known allergies.   Review of Systems Review of Systems Per HPI  Physical Exam Triage Vital Signs ED Triage Vitals  Encounter Vitals Group     BP 09/10/23 1514 113/68     Systolic BP Percentile --      Diastolic BP Percentile --      Pulse Rate 09/10/23 1514 68      Resp 09/10/23 1514 18     Temp 09/10/23 1514 98.1 F (36.7 C)     Temp src --      SpO2 09/10/23 1514 94 %     Weight --      Height --      Head Circumference --      Peak Flow --      Pain Score 09/10/23 1512 0     Pain Loc --      Pain Education --      Exclude from Growth Chart --    No data found.  Updated Vital Signs BP 113/68   Pulse 68   Temp 98.1 F (36.7 C)   Resp 18   SpO2 94%    Physical Exam Vitals and nursing note reviewed.  Constitutional:      General: He is not in acute distress. HENT:     Mouth/Throat:     Pharynx: Oropharynx is clear.  Cardiovascular:     Rate and Rhythm: Normal rate and regular rhythm.     Pulses: Normal pulses.  Pulmonary:     Effort: Pulmonary effort is normal.  Musculoskeletal:  General: No swelling, tenderness or deformity. Normal range of motion.     Comments: Full ROM. No swelling or tenderness. Sensation normal  Skin:    General: Skin is warm and dry.     Capillary Refill: Capillary refill takes less than 2 seconds.  Neurological:     Mental Status: He is alert and oriented to person, place, and time.     UC Treatments / Results  Labs (all labs ordered are listed, but only abnormal results are displayed) Labs Reviewed - No data to display  EKG  Radiology No results found.  Procedures Procedures (including critical care time)  Medications Ordered in UC Medications - No data to display  Initial Impression / Assessment and Plan / UC Course  I have reviewed the triage vital signs and the nursing notes.  Pertinent labs & imaging results that were available during my care of the patient were reviewed by me and considered in my medical decision making (see chart for details).  Work note for an additional day is provided by this provider. Advised continue RICE therapy and pain control at home, follow with orthopedics if symptoms return or worsen. Patient agrees to plan, no questions   Final Clinical  Impressions(s) / UC Diagnoses   Final diagnoses:  Sprain of right ankle, unspecified ligament, subsequent encounter   Discharge Instructions   None    ED Prescriptions   None    PDMP not reviewed this encounter.   Newton Barer 09/10/23 1537

## 2023-10-30 ENCOUNTER — Encounter: Payer: Self-pay | Admitting: Family Medicine

## 2023-10-30 ENCOUNTER — Ambulatory Visit: Payer: Self-pay

## 2023-10-30 ENCOUNTER — Ambulatory Visit: Admitting: Family Medicine

## 2023-10-30 VITALS — BP 122/84 | HR 78 | Temp 97.9°F | Ht 70.0 in | Wt 220.0 lb

## 2023-10-30 DIAGNOSIS — R030 Elevated blood-pressure reading, without diagnosis of hypertension: Secondary | ICD-10-CM

## 2023-10-30 DIAGNOSIS — Z13220 Encounter for screening for lipoid disorders: Secondary | ICD-10-CM | POA: Diagnosis not present

## 2023-10-30 NOTE — Telephone Encounter (Signed)
 Appt scheduled

## 2023-10-30 NOTE — Progress Notes (Signed)
   Subjective:    Patient ID: Troy Goodman, male    DOB: Feb 12, 1996, 28 y.o.   MRN: [id number]  HPI Elevated BP readings at work reported after having drank an energy drink 160/110 with headache , dizziness, cloudy vision Pt states this happened last Friday at work and needs to assure his job that bp is under control Discussed the use of AI scribe software for clinical note transcription with the patient, who gave verbal consent to proceed.  History of Present Illness   Troy Goodman is a 27 year old male who presents with elevated blood pressure and symptoms of headache and weakness.  He has experienced elevated blood pressure readings over the past few days, initially recorded at 160/110 mmHg at work, which prompted a visit to the nurse. It subsequently decreased to 150/100 mmHg and then to 130/90 mmHg. He attributes the initial spike to consuming a Bane energy drink late at night.  During the episode of elevated blood pressure, he experienced symptoms of 'cloudy thinking,' headache, and weakness. These symptoms have since resolved, and he no longer experiences frequent headaches. No fever or chills were reported.  He has a history of normal blood pressure readings over the past several months, except for the recent episode. He does not frequently experience headaches and denies any regular use of high-caffeine energy drinks.  He is mindful of his eating habits and tries to maintain a healthy lifestyle. He consumes herbal supplements such as Moringa and burdock for energy and cleansing purposes. He typically gets six to seven hours of sleep per night but acknowledges a busy schedule, working twelve-hour shifts and assisting his brother with family responsibilities.      Took amlodipine  last year as needed   Review of Systems     Objective:   Physical Exam General-in no acute distress Eyes-no discharge Lungs-respiratory rate normal, CTA CV-no murmurs,RRR Extremities skin warm  dry no edema Neuro grossly normal Behavior normal, alert Blood pressure was checked several times Best reading 128/88       Assessment & Plan:   Borderline blood pressure Better eating habits Fitting in regular walking for exercise Recheck again in 6 weeks Hold off on medications If blood pressure not under good control consideration for amlodipine   Also impressed upon the patient the need to stay away from energy drinks

## 2023-10-30 NOTE — Patient Instructions (Signed)

## 2023-10-30 NOTE — Telephone Encounter (Signed)
 FYI Only or Action Required?: FYI only for provider  Patient was last seen in primary care on 03/29/2023 by Cook, Jayce G, DO. Called Nurse Triage reporting Hypertension. Symptoms began several days ago. Interventions attempted: Nothing. Symptoms are: gradually worsening.  Triage Disposition: See Physician Within 24 Hours  Patient/caregiver understands and will follow disposition?: Yes       Copied from CRM [id number]. Topic: Clinical - Red Word Triage >> Oct 30, 2023  8:08 AM Fonda T wrote: [facility] that prompted transfer to Nurse Triage: Dizziness, headaches, high blood pressure, most current reading was 160/110 Reason for Disposition  Systolic BP  >= 180 OR Diastolic >= 110  Answer Assessment - Initial Assessment Questions 1. BLOOD PRESSURE: What is the blood pressure? Did you take at least two measurements 5 minutes apart?     Last reading was on Friday at work and it was 160/110     Saturday it was 130/90 at work 2. ONSET: When did you take your blood pressure?     Patient only has the ability to take his BP at work where they have a BP cuff. 3. HOW: How did you take your blood pressure? (e.g., automatic home BP monitor, visiting nurse)     At work with an automatic cuff 4. HISTORY: Do you have a history of high blood pressure?     States he was on Amlodopine in the past about a year ago, he stopped taking it because he felt better 5. MEDICINES: Are you taking any medicines for blood pressure? Have you missed any doses recently?     Not on any medications 6. OTHER SYMPTOMS: Do you have any symptoms? (e.g., blurred vision, chest pain, difficulty breathing, headache, weakness)     Intermittent headaches, dizziness  Protocols used: Blood Pressure - High-A-AH

## 2023-11-01 ENCOUNTER — Other Ambulatory Visit: Payer: Self-pay

## 2023-11-01 DIAGNOSIS — E785 Hyperlipidemia, unspecified: Secondary | ICD-10-CM

## 2023-11-01 DIAGNOSIS — R030 Elevated blood-pressure reading, without diagnosis of hypertension: Secondary | ICD-10-CM

## 2023-11-05 ENCOUNTER — Encounter: Payer: Self-pay | Admitting: Family Medicine

## 2023-11-27 ENCOUNTER — Ambulatory Visit: Admitting: Physician Assistant

## 2023-11-28 ENCOUNTER — Encounter: Payer: Self-pay | Admitting: Family Medicine
